# Patient Record
Sex: Female | Born: 1970 | Race: White | Hispanic: No | Marital: Married | Smoking: Never smoker
Health system: Southern US, Community
[De-identification: ages and names within clinical notes are randomized; demographics above are authoritative.]

---

## 2014-06-29 ENCOUNTER — Emergency Department (HOSPITAL_COMMUNITY): Payer: 59

## 2014-06-29 ENCOUNTER — Encounter (HOSPITAL_COMMUNITY): Payer: Self-pay | Admitting: Emergency Medicine

## 2014-06-29 ENCOUNTER — Emergency Department (HOSPITAL_COMMUNITY)
Admission: EM | Admit: 2014-06-29 | Discharge: 2014-06-30 | Disposition: A | Discharge status: Home / Self Care | Payer: 59 | Attending: Emergency Medicine | Admitting: Emergency Medicine

## 2014-06-29 DIAGNOSIS — W1839XA Other fall on same level, initial encounter: Secondary | ICD-10-CM | POA: Insufficient documentation

## 2014-06-29 DIAGNOSIS — S32019A Unspecified fracture of first lumbar vertebra, initial encounter for closed fracture: Secondary | ICD-10-CM | POA: Diagnosis not present

## 2014-06-29 DIAGNOSIS — Y9389 Activity, other specified: Secondary | ICD-10-CM | POA: Diagnosis not present

## 2014-06-29 DIAGNOSIS — S2020XA Contusion of thorax, unspecified, initial encounter: Secondary | ICD-10-CM

## 2014-06-29 DIAGNOSIS — Y998 Other external cause status: Secondary | ICD-10-CM | POA: Insufficient documentation

## 2014-06-29 DIAGNOSIS — S32039A Unspecified fracture of third lumbar vertebra, initial encounter for closed fracture: Secondary | ICD-10-CM | POA: Insufficient documentation

## 2014-06-29 DIAGNOSIS — S32049A Unspecified fracture of fourth lumbar vertebra, initial encounter for closed fracture: Secondary | ICD-10-CM | POA: Diagnosis not present

## 2014-06-29 DIAGNOSIS — S32009A Unspecified fracture of unspecified lumbar vertebra, initial encounter for closed fracture: Secondary | ICD-10-CM

## 2014-06-29 DIAGNOSIS — S301XXA Contusion of abdominal wall, initial encounter: Secondary | ICD-10-CM | POA: Insufficient documentation

## 2014-06-29 DIAGNOSIS — Z880 Allergy status to penicillin: Secondary | ICD-10-CM | POA: Insufficient documentation

## 2014-06-29 DIAGNOSIS — S3992XA Unspecified injury of lower back, initial encounter: Secondary | ICD-10-CM | POA: Diagnosis present

## 2014-06-29 DIAGNOSIS — Y9289 Other specified places as the place of occurrence of the external cause: Secondary | ICD-10-CM | POA: Insufficient documentation

## 2014-06-29 DIAGNOSIS — S32029A Unspecified fracture of second lumbar vertebra, initial encounter for closed fracture: Secondary | ICD-10-CM | POA: Diagnosis not present

## 2014-06-29 MED ORDER — IOHEXOL 300 MG/ML  SOLN
100.0000 mL | Freq: Once | INTRAMUSCULAR | Status: AC | PRN
  Administered 2014-06-29: 100 mL via INTRAVENOUS

## 2014-06-29 MED ORDER — HYDROCODONE-ACETAMINOPHEN 5-325 MG PO TABS
1.0000 | ORAL_TABLET | Freq: Once | ORAL | Status: AC
  Administered 2014-06-29: 1 via ORAL
  Filled 2014-06-29: qty 1

## 2014-06-29 MED ORDER — IBUPROFEN 800 MG PO TABS
800.0000 mg | ORAL_TABLET | Freq: Once | ORAL | Status: AC
  Administered 2014-06-29: 800 mg via ORAL
  Filled 2014-06-29: qty 1

## 2014-06-29 NOTE — ED Provider Notes (Signed)
CSN: [id number]     Arrival date & time 06/29/14  1950 History   First MD Initiated Contact with Patient 06/29/14 2238     Chief Complaint  Patient presents with  . Fall     (Consider location/radiation/quality/duration/timing/severity/associated sxs/prior Treatment) Patient is a 44 y.o. female presenting with fall.  Fall This is a new problem. The current episode started today (5pm). The problem occurs constantly. The problem has been unchanged. Associated symptoms comments: Left side, back and hip pain. Nothing aggravates the symptoms. She has tried nothing for the symptoms. The treatment provided no relief.    History reviewed. No pertinent past medical history. History reviewed. No pertinent past surgical history. No family history on file. History  Substance Use Topics  . Smoking status: Never Smoker   . Smokeless tobacco: Not on file  . Alcohol Use: Yes   OB History    No data available     Review of Systems  All other systems reviewed and are negative.     Allergies  Amoxicillin and Latex  Home Medications   Prior to Admission medications   Medication Sig Start Date End Date Taking? Authorizing Provider  HYDROcodone-acetaminophen (NORCO/VICODIN) 5-325 MG per tablet Take 1 tablet by mouth every 4 (four) hours as needed for moderate pain. 06/30/14   Leo Grosser, MD   BP 121/58 mmHg  Pulse 75  Temp(Src) 98.4 F (36.9 C) (Oral)  Resp 16  Ht 5' 6.5" (1.689 m)  Wt 239 lb (108.41 kg)  BMI 38.00 kg/m2  SpO2 100% Physical Exam  Constitutional: She is oriented to person, place, and time. She appears well-developed and well-nourished. No distress.  HENT:  Head: Normocephalic.  Eyes: Conjunctivae are normal.  Neck: Neck supple. No tracheal deviation present.  Cardiovascular: Normal rate and regular rhythm.   Pulmonary/Chest: Effort normal. No respiratory distress.  Abdominal: Soft. She exhibits no distension.  Musculoskeletal:       Lumbar back: She exhibits  tenderness (over left lateral portion).  Ecchymosis extending over the left flank lateral to the spinous processes. She has a small abrasion over the right part of her back.  Neurological: She is alert and oriented to person, place, and time.  Skin: Skin is warm and dry.  Psychiatric: She has a normal mood and affect.    ED Course  Procedures (including critical care time) Labs Review Labs Reviewed - No data to display  Imaging Review Dg Ribs Unilateral W/chest Left  06/29/2014   CLINICAL DATA:  44 year old female who fell this afternoon while moving boxes. Pain. Initial encounter.  EXAM: LEFT RIBS AND CHEST - 3+ VIEW  COMPARISON:  None.  FINDINGS: Large body habitus. Somewhat low lung volumes. Normal cardiac size and mediastinal contours. Visualized tracheal air column is within normal limits. The lungs are clear. No pneumothorax or effusion.  Mild thoracolumbar scoliosis. Bone mineralization is within normal limits. Rib marker placed at the left posterior lateral tenth rib fracture tenth rib level. No displaced left rib fracture. Visualized left shoulder osseous structures appear intact.  IMPRESSION: 1.  No displaced left rib fracture identified. 2.  No acute cardiopulmonary abnormality.   Electronically Signed   By: Genevie Ann M.D.   On: 06/29/2014 21:42   Dg Thoracic Spine 2 View  06/29/2014   CLINICAL DATA:  [place] this afternoon what caring moving boxes. Pain entire back, neck, and left thorax.  EXAM: THORACIC SPINE - 2 VIEW  COMPARISON:  None.  FINDINGS: Mild thoracolumbar scoliosis convex towards the  left. No anterior subluxation of the thoracic vertebrae. No vertebral compression deformities. No focal bone lesion or bone destruction. No paraspinal soft tissue swelling.  IMPRESSION: Mild thoracolumbar scoliosis.  No acute bony abnormalities.   Electronically Signed   By: Lucienne Capers M.D.   On: 06/29/2014 21:42   Dg Lumbar Spine Complete  06/29/2014   CLINICAL DATA:  Pain following fall   EXAM: LUMBAR SPINE - COMPLETE 4+ VIEW  COMPARISON:  None.  FINDINGS: Frontal, lateral, spot lumbosacral lateral, and bilateral oblique views were obtained. There are 5 non-rib-bearing lumbar type vertebral bodies. There are fractures of the left L1, L2, L3, and L4 transverse processes. No other fractures are apparent. No spondylolisthesis. There is no appreciable disc space narrowing. There is no appreciable facet hypertrophy.  IMPRESSION: Fractures of the left L1, L2, L3, and L4 transverse processes. No spondylolisthesis. No appreciable arthropathy.   Electronically Signed   By: Lowella Grip III M.D.   On: 06/29/2014 21:43   Ct Abdomen Pelvis W Contrast  06/30/2014   CLINICAL DATA:  44 year old female fell from a truck and hit left side on planter. Left-sided pain and back pain.  EXAM: CT ABDOMEN AND PELVIS WITH CONTRAST  TECHNIQUE: Multidetector CT imaging of the abdomen and pelvis was performed using the standard protocol following bolus administration of intravenous contrast.  CONTRAST:  16m OMNIPAQUE IOHEXOL 300 MG/ML  SOLN  COMPARISON:  None.  FINDINGS: Mild dependent atelectasis in the lung bases.  The liver, spleen, gallbladder, pancreas, adrenal glands, kidneys, abdominal aorta, inferior vena cava, and retroperitoneal lymph nodes are unremarkable. Stomach, small bowel, and colon are not abnormally distended. No free air or free fluid in the abdomen. Mild infiltration in the subcutaneous fat over the right flank may indicate mild contusion. No specific hematoma.  Pelvis: Appendix is normal. Uterus is surgically absent. No pelvic mass or lymphadenopathy. Bladder wall is not thickened. No free or loculated pelvic fluid collections. No inflammatory changes in the sigmoid colon.  Normal alignment of the lumbar spine. Mild degenerative change at L1-2. Fractures demonstrated at the left transverse processes of L1, L2, L3, and L4. No vertebral compression deformities. Visualized ribs appear intact.  Sacrum, pelvis, and hips appear intact.  IMPRESSION: Fractures of the left transverse processes of L1, L2, L3, and L4. No acute posttraumatic changes demonstrated in the abdomen or pelvis. No evidence of solid organ injury or bowel perforation.   Electronically Signed   By: WLucienne CapersM.D.   On: 06/30/2014 00:18     EKG Interpretation None      MDM   Final diagnoses:  Lumbar transverse process fracture, closed, initial encounter  Contusion, multiple sites of trunk, initial encounter   44year old female presents after a fall out of the back of a moving truck where she landed on a concrete planter striking the lower portion of her back and left flank. She has had worsening pain throughout the day and difficulty ambulating due to pain but she has full movement all her extremities and is otherwise well-appearing. Her x-rays show for TP fractures of her lumbar spine on the left side concerning for a higher energy mechanism and than initially considered. A CT scan of her abdomen and pelvis was ordered to evaluate her spleen, viscus organs, and other adjacent structures.  These for adjacent TP fractures are isolated injuries on the CT scan. She was provided with oral analgesia for home, recommended slowly increasing mobility and referred to the on-call spine surgery attending for close  follow-up and potential brace placement as well as physical therapy given that she does not have a primary care physician in town.    Leo Grosser, MD 06/30/14 [id number]  Sherwood Gambler, MD 07/01/14 0786

## 2014-06-29 NOTE — ED Notes (Signed)
Pt. fell while unloading a truck this evening , hit her back against a planter , presents with with left lateral ribcage pain , mid back/low back pain , no LOC /ambulatory . Pain increases with movement and changing positions .

## 2014-06-30 MED ORDER — HYDROCODONE-ACETAMINOPHEN 5-325 MG PO TABS: 1.0000 | ORAL_TABLET | ORAL | Status: AC | PRN

## 2014-06-30 NOTE — Discharge Instructions (Signed)
Lumbar Fracture A fracture of a bone is the same as a break in the bone. A fracture in the lumbar area is a break that involves one of many parts that make up the 5 bones of the low back area. This is just above the pelvis.  CAUSES Most of these injuries occur as a result of an accident such as:  A fall.  A car accident.  Recreational activities.  A smaller number occur due to:  Industrial, farm, and aviation accidents.  Gunshot wounds and direct blows to the back.  Parachuting incidents. Most lumbar fractures affect the "building blocks" or the main portion of the spine known as the "vertebral bodies" (see the image on the right). A smaller number involve breaks to portions of bone that extend to the sides or backward behind the vertebral body. In the elderly, a sudden break can happen without an apparent cause. This is because the bones of the back have become extremely thin and fragile. This condition is known as osteoporosis. SYMPTOMS Patients with lumbar fractures have severe pain even if the actual break is small or limited, and there is no injury to nearby nerves. More severe or complex injuries involving other bones and/or organs may include:   Deformity of the back bones.  Swelling/bruising over the injured area.  Limited ability to move the affected area.  Partial or complete loss of function of the bladder and/or bowels. (This may be due to injury to nearby nerves).  More severe injuries can also cause:  Loss of sensation and/or strength in the legs, feet, and toes.  Paralysis. DIAGNOSIS In most cases, a lumbar fracture will be suspected by what happened just prior to the onset of back pain. X-rays and special imaging (CT scan and MRI imaging) are used to confirm the diagnosis as well as finding out the type and severity of the break or breaks. These tests guide treatment. But there are times when special imaging cannot be done. For example, MRI cannot be done if there  is an implanted metallic device (such as a pacemaker). In these cases, other tests and imaging are done. If there has been nerve damage, more tests can be done. These include:  Tests of nerve function through muscles (nerve conduction studies and electromyography).  Tests of bladder function (urodynamics).  Tests that focus on defining specific nerve problems before surgery and what improvement has come about after surgery (evoked potentials). TREATMENT Common injuries may involve a small break off of the main surface of the back bone. Or they may be in the form of a partial flattening or compression of the bone. Hospital care may not be needed for these. Medicine for pain control, special back bracing, and limitations in activity are done first. Physical therapy follows later. Complex breaks, multiple fractures of the spine, or unstable injuries can damage the spinal cord. They may require an operation to remove pressure from the nerves and/or spinal cord and to stabilize the broken pieces of bone. Each individual set of injuries is unique. The surgeon will take into consideration many things when planning the best surgical approach that will give the highest likelihood of a good outcome.  HOME CARE INSTRUCTIONS There is pain and stiffness in the back for weeks after a vertebral fracture. Bed rest, pain medicine, and a slow return to activity are generally recommended. Neck and back braces may be helpful in reducing pain and increasing mobility. When your pain allows, simple walking will help to begin the  process of returning to normal activities. Exercises to improve motion and to strengthen the back may also be useful after the initial pain goes away. This will be guided by your caregiver and the team (nurses, physical therapists, occupational therapists, etc.) involved with your ongoing care. For the elderly, treatment for osteoporosis may be needed to help reduce the risk of fractures in the  future. Arrange for follow-up care as recommended to assure proper long-term care and prevention of further spine injury. The failure to follow-up as recommended could result in permanent injury, disability, and a chronic painful condition. SEEK MEDICAL CARE IF:  Pain is not effectively controlled with medication.  You feel unable to decrease pain medication over time as planned.  Activity level is not improving as planned and/or expected. SEEK IMMEDIATE MEDICAL CARE IF:  You have increasing pain, vomiting, or are unable to move around at all.  You have numbness, tingling, weakness, or paralysis of any part of your body.  You have loss of normal bowel or bladder control.  You have difficulty breathing, cough, fever, chest or abdominal pain. Document Released: 04/14/2006 Document Revised: 03/22/2011 Document Reviewed: 12/13/2006 [facility] Patient Information 2015 [place], [place]. This information is not intended to replace advice given to you by your health care provider. Make sure you discuss any questions you have with your health care provider.

## 2014-09-24 ENCOUNTER — Other Ambulatory Visit: Payer: Self-pay

## 2014-09-24 DIAGNOSIS — Z1231 Encounter for screening mammogram for malignant neoplasm of breast: Secondary | ICD-10-CM

## 2014-09-30 ENCOUNTER — Ambulatory Visit
Admission: RE | Admit: 2014-09-30 | Discharge: 2014-09-30 | Disposition: A | Discharge status: Home / Self Care | Payer: 59 | Source: Ambulatory Visit

## 2014-09-30 DIAGNOSIS — Z1231 Encounter for screening mammogram for malignant neoplasm of breast: Secondary | ICD-10-CM

## 2014-10-01 ENCOUNTER — Ambulatory Visit
Admission: RE | Admit: 2014-10-01 | Discharge: 2014-10-01 | Disposition: A | Discharge status: Home / Self Care | Payer: 59 | Source: Ambulatory Visit | Attending: Internal Medicine | Admitting: Internal Medicine

## 2014-10-01 ENCOUNTER — Other Ambulatory Visit: Payer: Self-pay | Admitting: Internal Medicine

## 2014-10-01 DIAGNOSIS — S32009S Unspecified fracture of unspecified lumbar vertebra, sequela: Secondary | ICD-10-CM

## 2016-06-26 IMAGING — CR DG THORACIC SPINE 2V
3 series · 3 of 3 positions shown · non-contrast
Comparison: None.

CLINICAL DATA: Fell this afternoon what caring moving boxes. Pain
entire back, neck, and left thorax.

EXAM:
THORACIC SPINE - 2 VIEW

[t-spine ap]
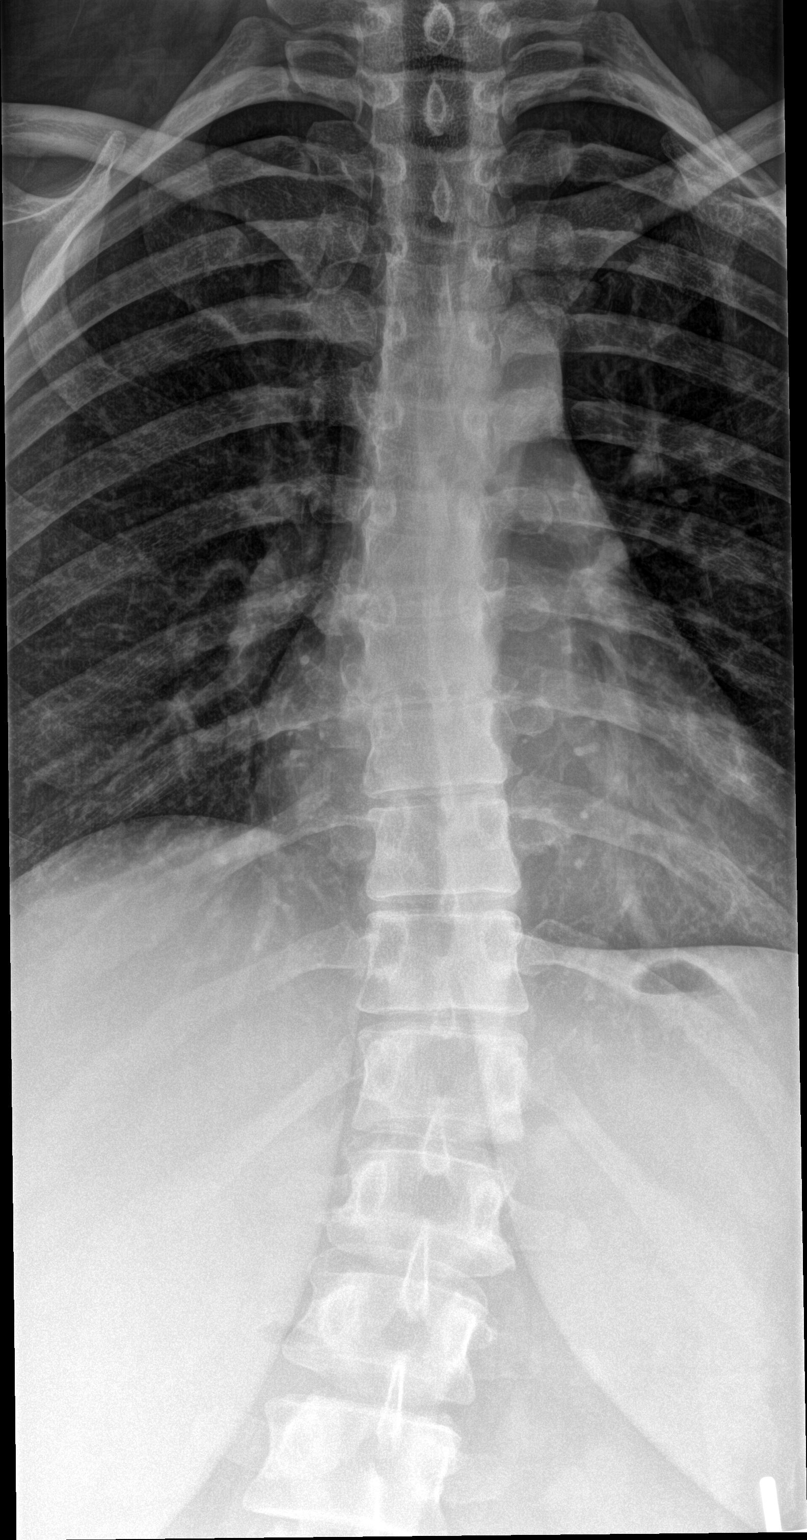

[t-spine lat]
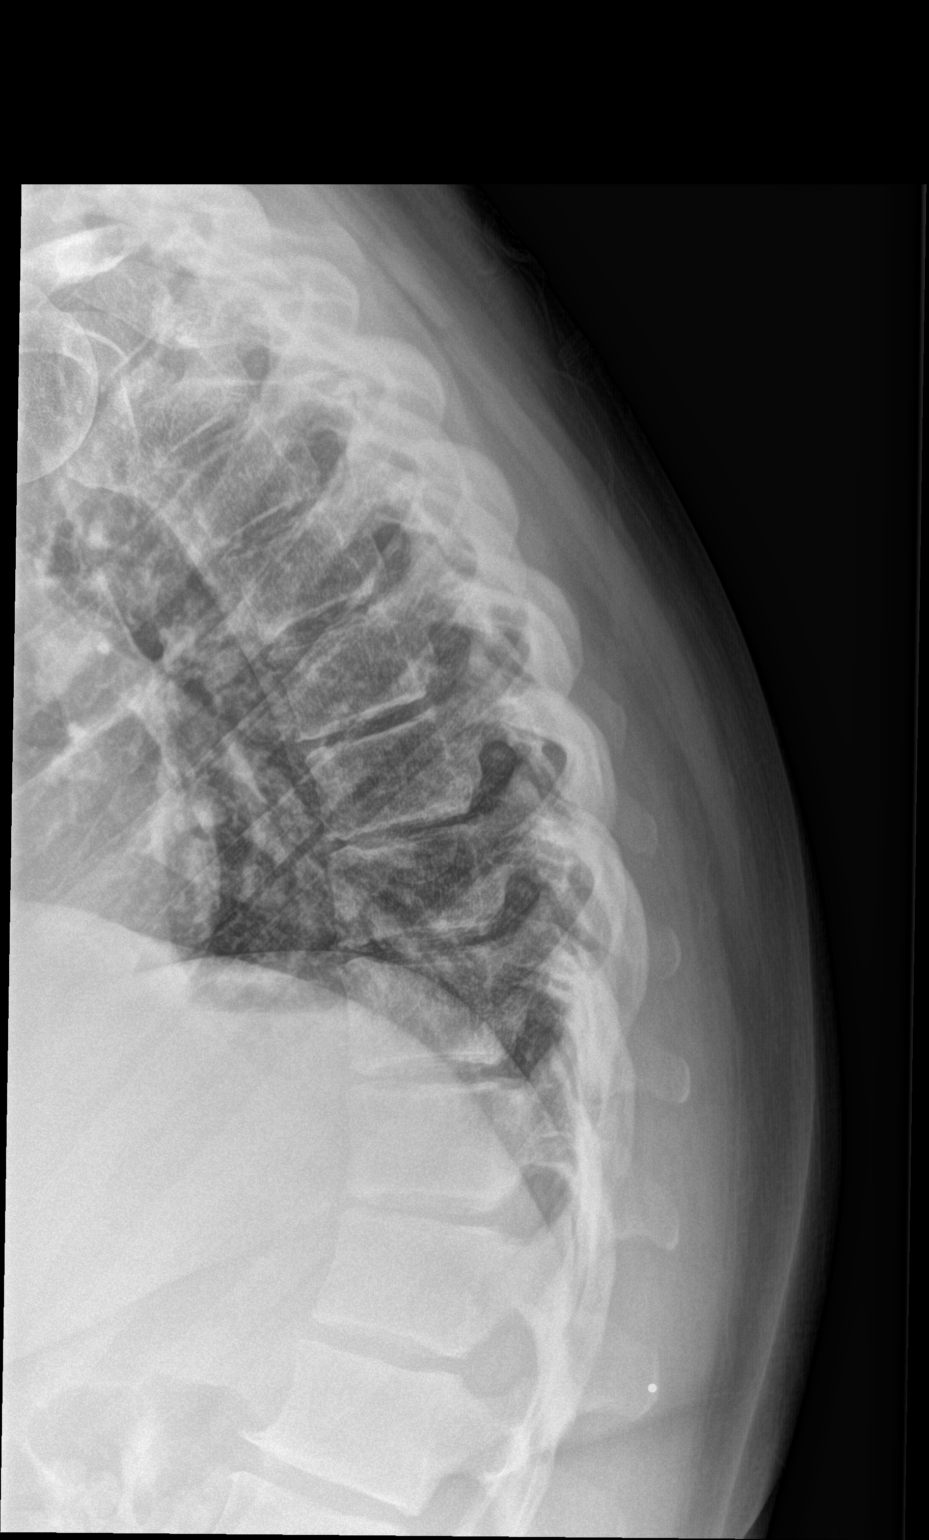

[t-spine swimmers]
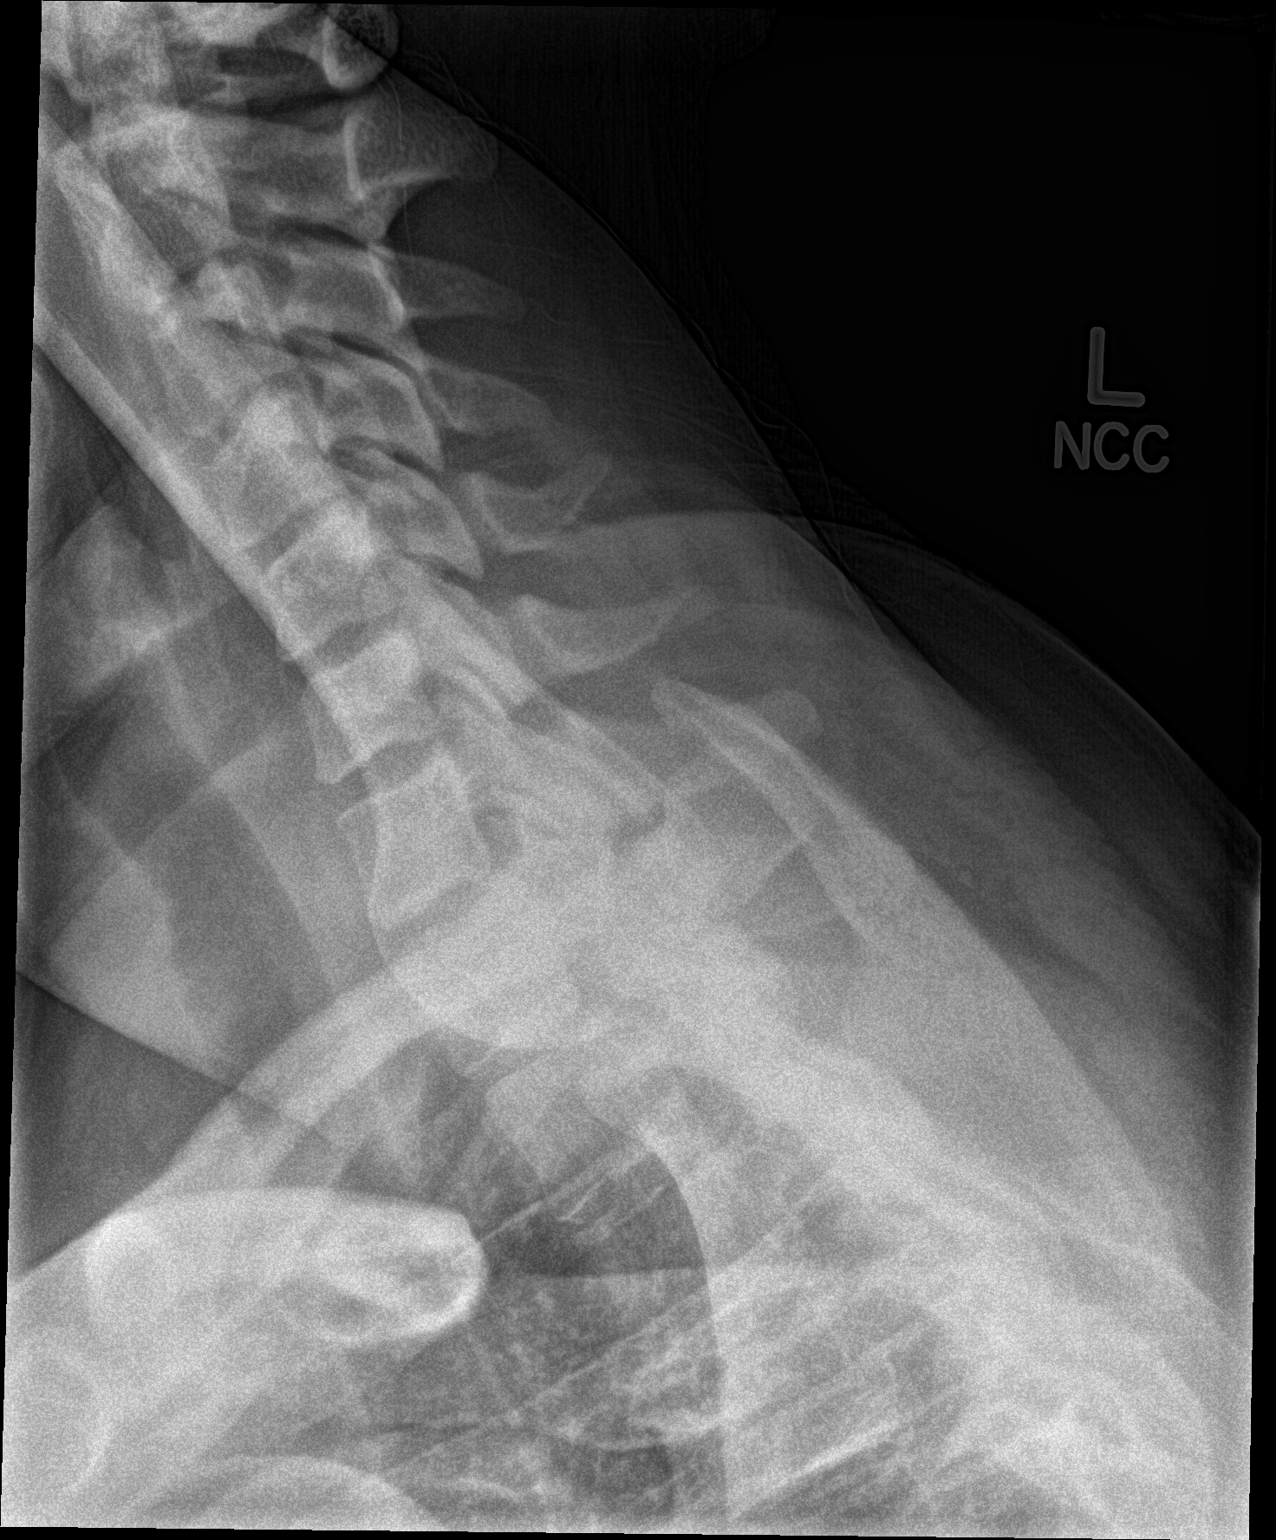

[3 of 3 positions shown; findings below may reference images not displayed]

FINDINGS: Mild thoracolumbar scoliosis convex towards the left. No anterior
subluxation of the thoracic vertebrae. No vertebral compression
deformities. No focal bone lesion or bone destruction. No paraspinal
soft tissue swelling.
IMPRESSION: Mild thoracolumbar scoliosis.  No acute bony abnormalities.

## 2016-06-26 IMAGING — CT CT ABD-PELV W/ CM
2 of 5 series · 16 of 46 positions shown, 18 images · IV contrast (Omni 300)
Comparison: None.

CLINICAL DATA: 43-year-old female fell from a truck and hit left
side on planter. Left-sided pain and back pain.

EXAM:
CT ABDOMEN AND PELVIS WITH CONTRAST
TECHNIQUE: Multidetector CT imaging of the abdomen and pelvis was performed
using the standard protocol following bolus administration of
intravenous contrast.
CONTRAST:  100mL OMNIPAQUE IOHEXOL 300 MG/ML  SOLN

[Series 2: abd/ pelvis 5.0 i30f 1 · axial · 0.97mm/px · z∈[+870,+1310]mm · 13 of 100 slices shown, 15 images]
[im 6/100  soft-tissue]
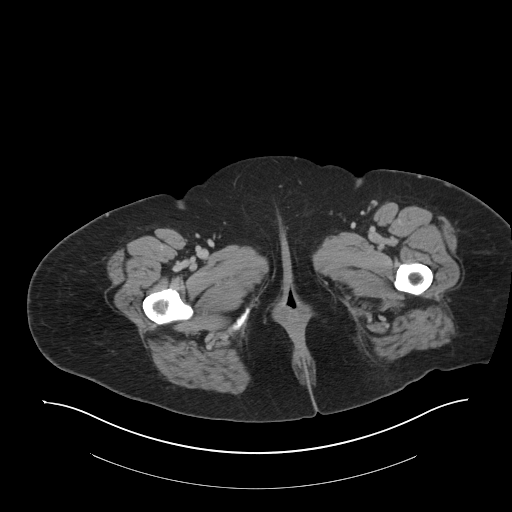
[im 6/100  bone]
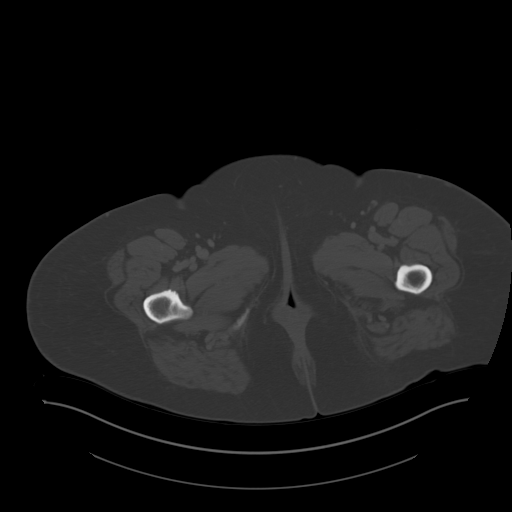
[im 12/100  soft-tissue]
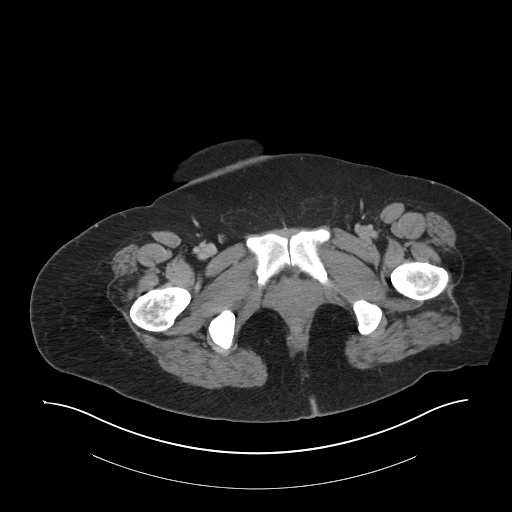
[im 23/100  soft-tissue]
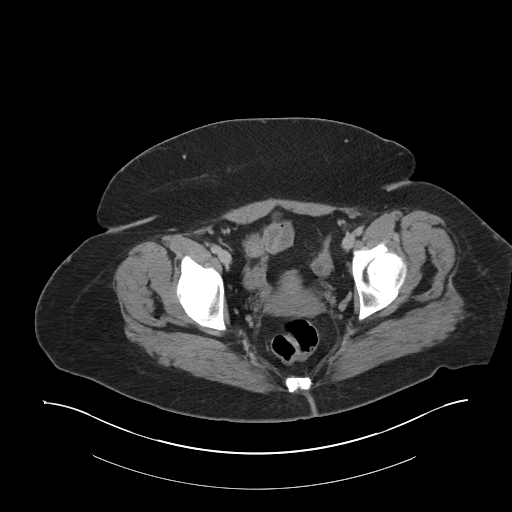
[im 28/100  soft-tissue]
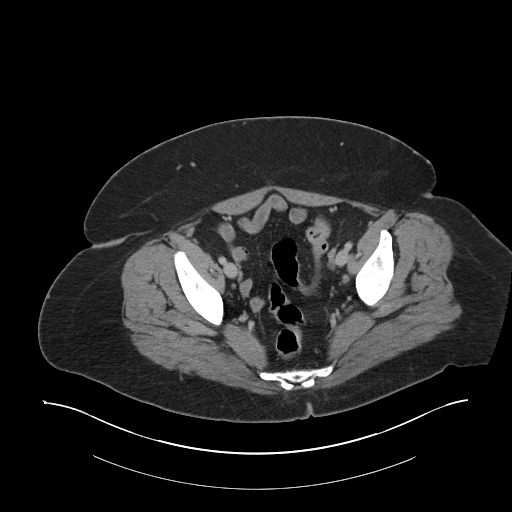
[im 34/100  soft-tissue]
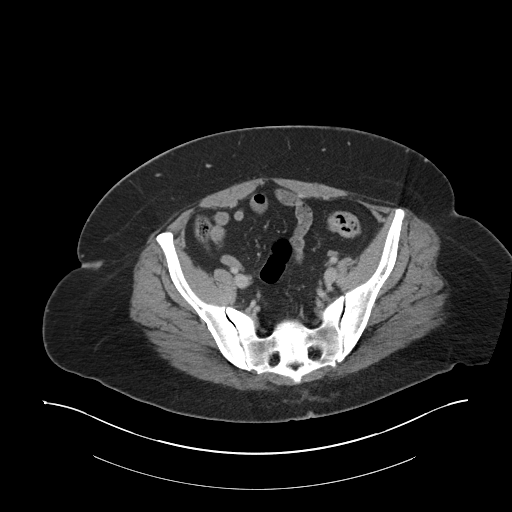
[im 45/100  soft-tissue]
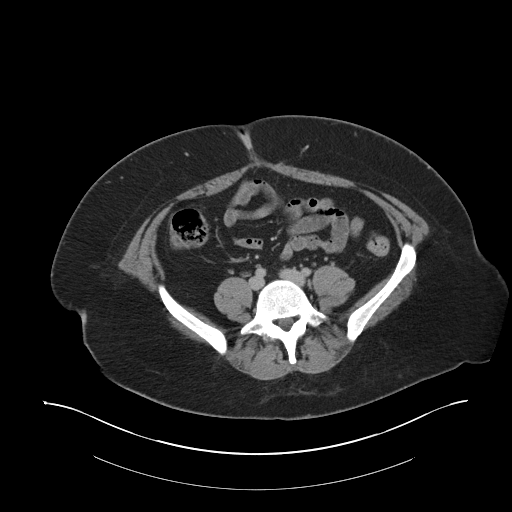
[im 50/100  soft-tissue]
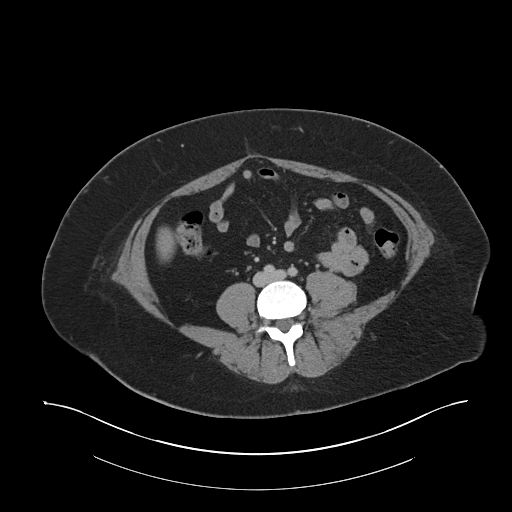
[im 56/100  soft-tissue]
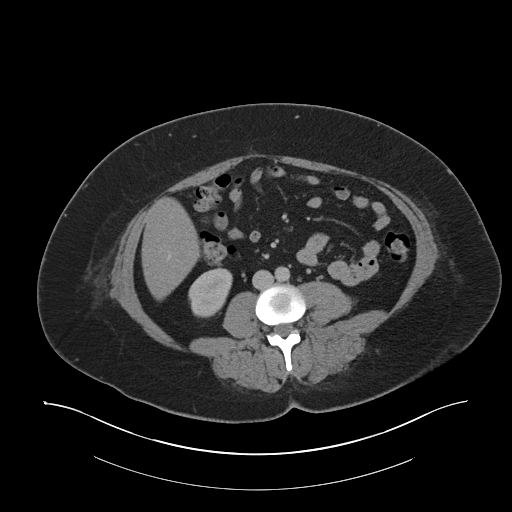
[im 67/100  soft-tissue]
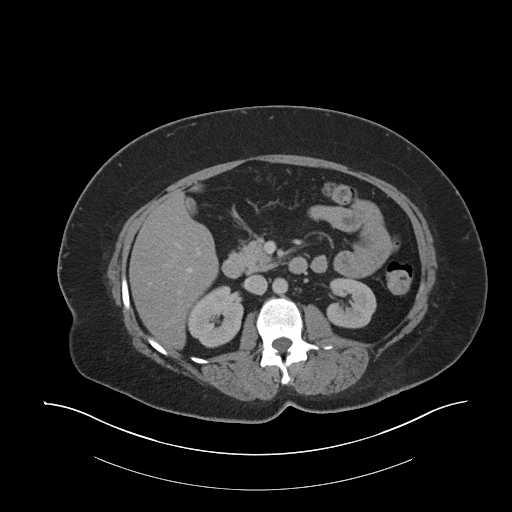
[im 67/100  bone]
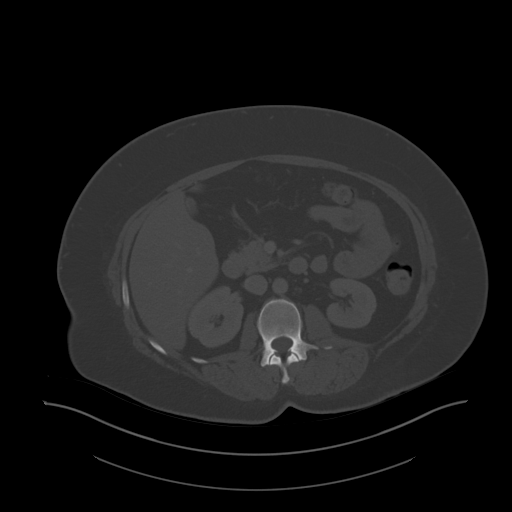
[im 72/100  soft-tissue]
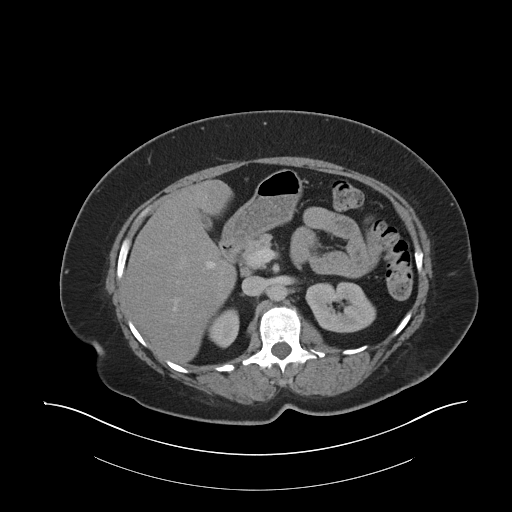
[im 78/100  soft-tissue]
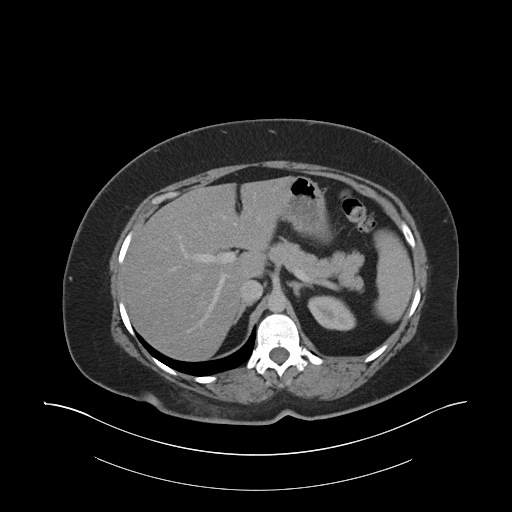
[im 89/100  soft-tissue]
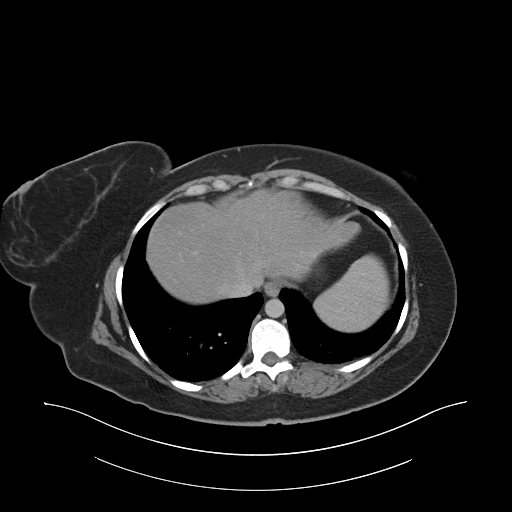
[im 94/100  soft-tissue]
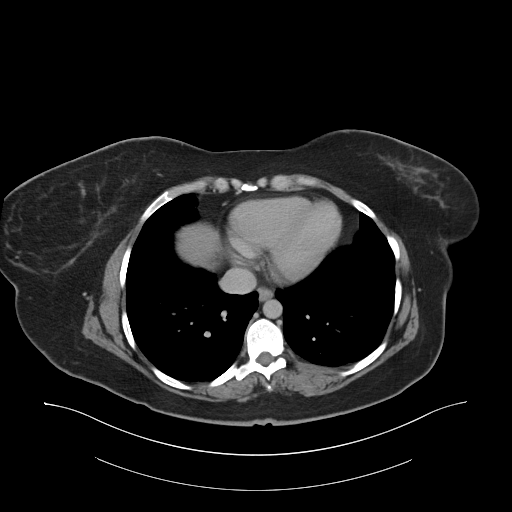

[Series 5: coronals · coronal · 0.77mm/px · 3 of 181 slices shown]
[im 61/181  soft-tissue]
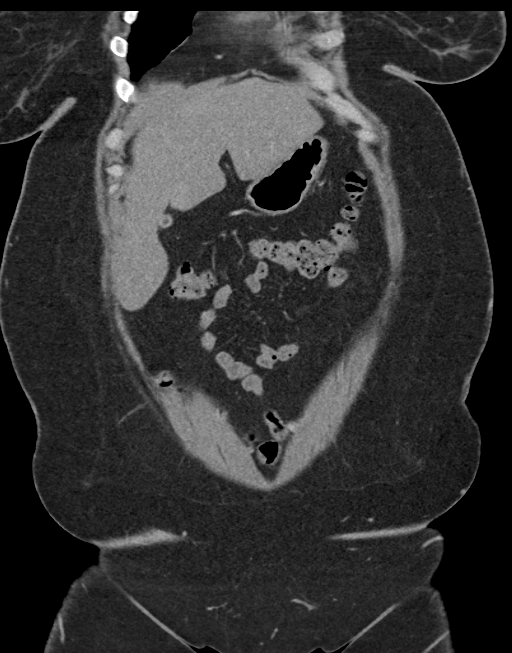
[im 81/181  soft-tissue]
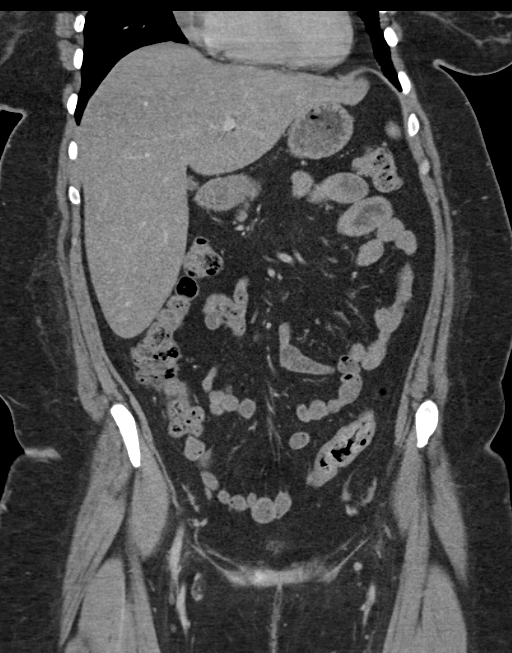
[im 101/181  soft-tissue]
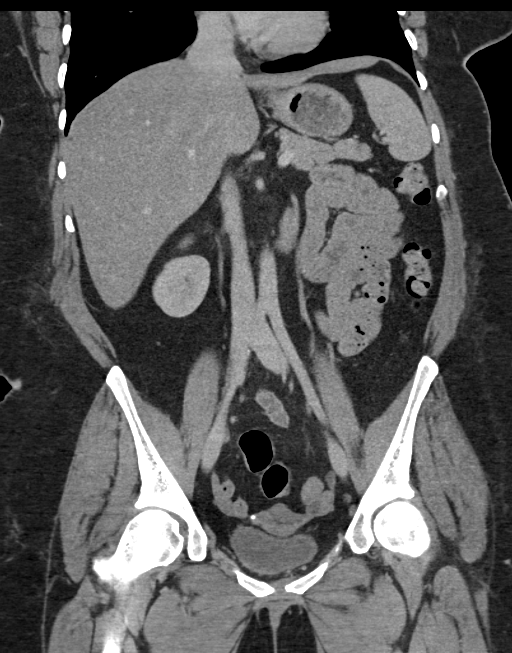

[16 of 46 positions shown; findings below may reference images not displayed]

FINDINGS: Mild dependent atelectasis in the lung bases.

The liver, spleen, gallbladder, pancreas, adrenal glands, kidneys,
abdominal aorta, inferior vena cava, and retroperitoneal lymph nodes
are unremarkable. Stomach, small bowel, and colon are not abnormally
distended. No free air or free fluid in the abdomen. Mild
infiltration in the subcutaneous fat over the right flank may
indicate mild contusion. No specific hematoma.

Pelvis: Appendix is normal. Uterus is surgically absent. No pelvic
mass or lymphadenopathy. Bladder wall is not thickened. No free or
loculated pelvic fluid collections. No inflammatory changes in the
sigmoid colon.

Normal alignment of the lumbar spine. Mild degenerative change at
L1-2. Fractures demonstrated at the left transverse processes of L1,
L2, L3, and L4. No vertebral compression deformities. Visualized
ribs appear intact. Sacrum, pelvis, and hips appear intact.
IMPRESSION: Fractures of the left transverse processes of L1, L2, L3, and L4. No
acute posttraumatic changes demonstrated in the abdomen or pelvis.
No evidence of solid organ injury or bowel perforation.
